# Patient Record
Sex: Male | Born: 1990 | Race: White | Hispanic: No | Marital: Single | Smoking: Current every day smoker
Health system: Southern US, Community
[De-identification: ages and names within clinical notes are randomized; demographics above are authoritative.]

## PROBLEM LIST (undated history)

## (undated) DIAGNOSIS — F4310 Post-traumatic stress disorder, unspecified: Secondary | ICD-10-CM

## (undated) DIAGNOSIS — F909 Attention-deficit hyperactivity disorder, unspecified type: Secondary | ICD-10-CM

## (undated) HISTORY — DX: Post-traumatic stress disorder, unspecified: F43.10

## (undated) HISTORY — PX: ABDOMINAL SURGERY: SHX537

## (undated) HISTORY — DX: Attention-deficit hyperactivity disorder, unspecified type: F90.9

---

## 2019-12-27 ENCOUNTER — Encounter: Payer: Self-pay | Admitting: Emergency Medicine

## 2019-12-27 ENCOUNTER — Other Ambulatory Visit: Payer: Self-pay

## 2019-12-27 ENCOUNTER — Emergency Department
Admission: EM | Admit: 2019-12-27 | Discharge: 2019-12-27 | Disposition: A | Discharge status: Home / Self Care | Payer: Self-pay | Attending: Emergency Medicine | Admitting: Emergency Medicine

## 2019-12-27 DIAGNOSIS — L0291 Cutaneous abscess, unspecified: Secondary | ICD-10-CM

## 2019-12-27 DIAGNOSIS — L02811 Cutaneous abscess of head [any part, except face]: Secondary | ICD-10-CM | POA: Insufficient documentation

## 2019-12-27 DIAGNOSIS — F17200 Nicotine dependence, unspecified, uncomplicated: Secondary | ICD-10-CM | POA: Insufficient documentation

## 2019-12-27 MED ORDER — SULFAMETHOXAZOLE-TRIMETHOPRIM 800-160 MG PO TABS
1.0000 | ORAL_TABLET | Freq: Two times a day (BID) | ORAL | 0 refills | Status: AC
Start: 2019-12-27 — End: ?

## 2019-12-27 MED ORDER — CEPHALEXIN 500 MG PO CAPS
500.0000 mg | ORAL_CAPSULE | Freq: Three times a day (TID) | ORAL | 0 refills | Status: AC
Start: 2019-12-27 — End: ?

## 2019-12-27 NOTE — ED Notes (Signed)
See triage note  Presents with possible abscess area   States he noticed area to back of head about 3 days ago

## 2019-12-27 NOTE — Discharge Instructions (Addendum)
Take the antibiotic as prescribed.  Apply warm compress to the affected areas Return if worsening
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
## 2019-12-27 NOTE — ED Triage Notes (Signed)
Pt to ED with c/o of abscess x2 to back of head. Pt states pain started approx 3 days ago.

## 2020-01-01 ENCOUNTER — Emergency Department: Payer: Self-pay

## 2020-01-01 ENCOUNTER — Other Ambulatory Visit: Payer: Self-pay

## 2020-01-01 ENCOUNTER — Observation Stay
Admission: EM | Admit: 2020-01-01 | Discharge: 2020-01-02 | Discharge status: Against Medical Advice | Payer: Self-pay | Attending: Internal Medicine | Admitting: Internal Medicine

## 2020-01-01 DIAGNOSIS — Z20822 Contact with and (suspected) exposure to covid-19: Secondary | ICD-10-CM | POA: Insufficient documentation

## 2020-01-01 DIAGNOSIS — Z91120 Patient's intentional underdosing of medication regimen due to financial hardship: Secondary | ICD-10-CM | POA: Insufficient documentation

## 2020-01-01 DIAGNOSIS — L0291 Cutaneous abscess, unspecified: Secondary | ICD-10-CM

## 2020-01-01 DIAGNOSIS — F319 Bipolar disorder, unspecified: Secondary | ICD-10-CM

## 2020-01-01 DIAGNOSIS — F17200 Nicotine dependence, unspecified, uncomplicated: Secondary | ICD-10-CM | POA: Insufficient documentation

## 2020-01-01 DIAGNOSIS — T43226A Underdosing of selective serotonin reuptake inhibitors, initial encounter: Secondary | ICD-10-CM | POA: Insufficient documentation

## 2020-01-01 DIAGNOSIS — R569 Unspecified convulsions: Principal | ICD-10-CM

## 2020-01-01 DIAGNOSIS — T43596A Underdosing of other antipsychotics and neuroleptics, initial encounter: Secondary | ICD-10-CM | POA: Insufficient documentation

## 2020-01-01 LAB — CBC
HCT: 34.8 % — ABNORMAL LOW (ref 39.0–52.0)
Hemoglobin: 12.1 g/dL — ABNORMAL LOW (ref 13.0–17.0)
MCH: 30 pg (ref 26.0–34.0)
MCHC: 34.8 g/dL (ref 30.0–36.0)
MCV: 86.1 fL (ref 80.0–100.0)
Platelets: 271 10*3/uL (ref 150–400)
RBC: 4.04 MIL/uL — ABNORMAL LOW (ref 4.22–5.81)
RDW: 13.1 % (ref 11.5–15.5)
WBC: 8 10*3/uL (ref 4.0–10.5)
nRBC: 0 % (ref 0.0–0.2)

## 2020-01-01 LAB — BASIC METABOLIC PANEL
Anion gap: 9 (ref 5–15)
BUN: 12 mg/dL (ref 6–20)
CO2: 26 mmol/L (ref 22–32)
Calcium: 9.1 mg/dL (ref 8.9–10.3)
Chloride: 102 mmol/L (ref 98–111)
Creatinine, Ser: 1 mg/dL (ref 0.61–1.24)
GFR calc Af Amer: >60 mL/min (ref >60)
GFR calc non Af Amer: >60 mL/min (ref >60)
Glucose, Bld: 124 mg/dL — ABNORMAL HIGH (ref 70–99)
Potassium: 4.1 mmol/L (ref 3.5–5.1)
Sodium: 137 mmol/L (ref 135–145)

## 2020-01-01 LAB — SARS CORONAVIRUS 2 BY RT PCR (HOSPITAL ORDER, PERFORMED IN ~~LOC~~ HOSPITAL LAB): SARS Coronavirus 2: NEGATIVE

## 2020-01-01 LAB — ETHANOL: Alcohol, Ethyl (B): <10 mg/dL (ref <10)

## 2020-01-01 LAB — MAGNESIUM: Magnesium: 2.1 mg/dL (ref 1.7–2.4)

## 2020-01-01 MED ORDER — SODIUM CHLORIDE 0.9 % IV SOLN: 75.0000 mL/h | INTRAVENOUS | Status: DC

## 2020-01-01 MED ORDER — ENOXAPARIN SODIUM 40 MG/0.4ML ~~LOC~~ SOLN: 40.0000 mg | SUBCUTANEOUS | Status: DC

## 2020-01-01 MED ORDER — LORAZEPAM 2 MG/ML IJ SOLN
1.0000 mg | Freq: Once | INTRAMUSCULAR | Status: AC
  Administered 2020-01-01: 1 mg via INTRAVENOUS
  Filled 2020-01-01: qty 1

## 2020-01-01 NOTE — ED Triage Notes (Signed)
Pt comes EMS from home after girlfriend couldn't get him to respond on the phone. Pt has been having seizures recently per girlfriend. Denies drugs/alcohol. Has multiple small wounds on body. Being treated for boil on head with antibiotics. CBG 157. Responsive to movement. Right wrist swollen and appears deformed.

## 2020-01-01 NOTE — ED Notes (Signed)
Pt resting in bed in NAD
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
## 2020-01-01 NOTE — ED Notes (Signed)
This Rn attempted to call report. Per ICU charge this Rn will received a call from assigned RN.

## 2020-01-01 NOTE — ED Notes (Signed)
Pt easy to wake and updated about his girlfriend to the best of this RNs ability. Pt feel back to sleep immediatly after assessment and EKG. Call bell in reach, lights dimmed and bed locked and in lowest position.

## 2020-01-01 NOTE — ED Notes (Signed)
Girlfriend at bedside. Dr Erma Heritage speaking to her.

## 2020-01-01 NOTE — ED Notes (Signed)
Pt taken to CT.

## 2020-01-01 NOTE — ED Notes (Signed)
Pt given meal tray and water 
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
## 2020-01-01 NOTE — Consult Note (Signed)
No major DDI noted with current meds with AEDs medications.   Paschal Dopp, PharmD, BCPS

## 2020-01-01 NOTE — ED Notes (Signed)
Pt given a phone to call family.per pt's request.

## 2020-01-02 DIAGNOSIS — R569 Unspecified convulsions: Secondary | ICD-10-CM

## 2020-01-02 LAB — CBC
HCT: 35 % — ABNORMAL LOW (ref 39.0–52.0)
Hemoglobin: 11.9 g/dL — ABNORMAL LOW (ref 13.0–17.0)
MCH: 29.9 pg (ref 26.0–34.0)
MCHC: 34 g/dL (ref 30.0–36.0)
MCV: 87.9 fL (ref 80.0–100.0)
Platelets: 279 10*3/uL (ref 150–400)
RBC: 3.98 MIL/uL — ABNORMAL LOW (ref 4.22–5.81)
RDW: 13 % (ref 11.5–15.5)
WBC: 5.3 10*3/uL (ref 4.0–10.5)
nRBC: 0 % (ref 0.0–0.2)

## 2020-01-02 LAB — BASIC METABOLIC PANEL
Anion gap: 7 (ref 5–15)
BUN: 13 mg/dL (ref 6–20)
CO2: 28 mmol/L (ref 22–32)
Calcium: 9 mg/dL (ref 8.9–10.3)
Chloride: 103 mmol/L (ref 98–111)
Creatinine, Ser: 0.91 mg/dL (ref 0.61–1.24)
GFR calc Af Amer: >60 mL/min (ref >60)
GFR calc non Af Amer: >60 mL/min (ref >60)
Glucose, Bld: 114 mg/dL — ABNORMAL HIGH (ref 70–99)
Potassium: 4.3 mmol/L (ref 3.5–5.1)
Sodium: 138 mmol/L (ref 135–145)

## 2020-01-02 LAB — URINE DRUG SCREEN, QUALITATIVE (ARMC ONLY)
Amphetamines, Ur Screen: POSITIVE — AB
Barbiturates, Ur Screen: NOT DETECTED
Benzodiazepine, Ur Scrn: NOT DETECTED
Cannabinoid 50 Ng, Ur ~~LOC~~: POSITIVE — AB
Cocaine Metabolite,Ur ~~LOC~~: NOT DETECTED
MDMA (Ecstasy)Ur Screen: NOT DETECTED
Methadone Scn, Ur: NOT DETECTED
Opiate, Ur Screen: POSITIVE — AB
Phencyclidine (PCP) Ur S: NOT DETECTED
Tricyclic, Ur Screen: NOT DETECTED

## 2020-01-02 LAB — HIV ANTIBODY (ROUTINE TESTING W REFLEX): HIV Screen 4th Generation wRfx: NONREACTIVE

## 2020-01-02 NOTE — Progress Notes (Signed)
Patient Left AMA , states he can't sleep with all construction , paper signed and witnessed by this writer , removed Telemetry and IV RFA , patient left with Significant other

## 2020-01-02 NOTE — Plan of Care (Signed)
  Problem: Coping: Goal: Ability to adjust to condition or change in health will improve Outcome: Progressing Goal: Ability to identify appropriate support needs will improve Outcome: Progressing   Problem: Health Behavior/Discharge Planning: Goal: Compliance with prescribed medication regimen will improve Outcome: Progressing   Problem: Medication: Goal: Risk for medication side effects will decrease Outcome: Progressing   Problem: Clinical Measurements: Goal: Diagnostic test results will improve Outcome: Progressing   Problem: Safety: Goal: Verbalization of understanding the information provided will improve Outcome: Progressing   Problem: Self-Concept: Goal: Level of anxiety will decrease Outcome: Progressing Goal: Ability to verbalize feelings about condition will improve Outcome: Progressing   Problem: Health Behavior/Discharge Planning: Goal: Ability to manage health-related needs will improve Outcome: Progressing   Problem: Clinical Measurements: Goal: Ability to maintain clinical measurements within normal limits will improve Outcome: Progressing Goal: Will remain free from infection Outcome: Progressing Goal: Diagnostic test results will improve Outcome: Progressing Goal: Respiratory complications will improve Outcome: Progressing   Problem: Activity: Goal: Risk for activity intolerance will decrease Outcome: Progressing   Problem: Nutrition: Goal: Adequate nutrition will be maintained Outcome: Progressing   Problem: Coping: Goal: Level of anxiety will decrease Outcome: Progressing   Problem: Elimination: Goal: Will not experience complications related to bowel motility Outcome: Progressing Goal: Will not experience complications related to urinary retention Outcome: Progressing   Problem: Pain Managment: Goal: General experience of comfort will improve Outcome: Progressing   Problem: Safety: Goal: Ability to remain free from injury will  improve Outcome: Progressing   Problem: Skin Integrity: Goal: Risk for impaired skin integrity will decrease Outcome: Progressing
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
## 2021-07-26 IMAGING — CT CT HEAD W/O CM
3 series · 15 of 47 positions shown, 18 images · non-contrast
Comparison: None.

CLINICAL DATA: Headache.  Seizure.

EXAM:
CT HEAD WITHOUT CONTRAST
TECHNIQUE: Contiguous axial images were obtained from the base of the skull
through the vertex without intravenous contrast.

[Series 3: head wo · axial · 0.45mm/px · z∈[-133,-8]mm · 9 of 31 slices shown, 12 images]
[im 3/31  brain]
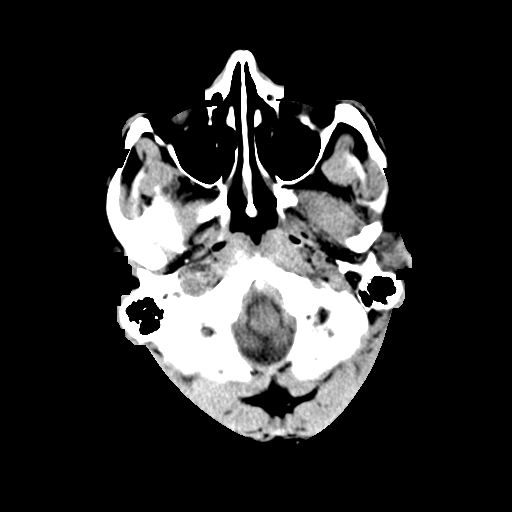
[im 3/31  bone]
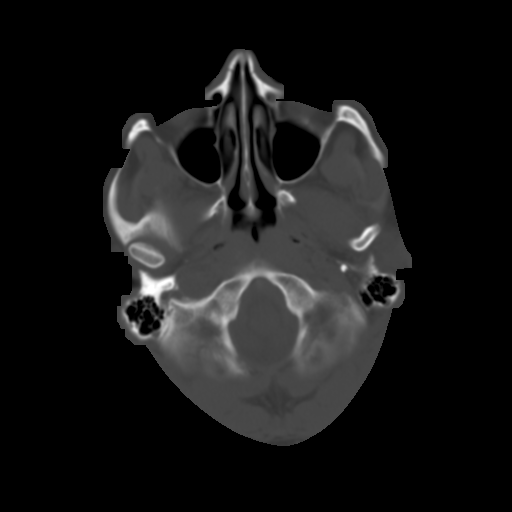
[im 6/31  brain]
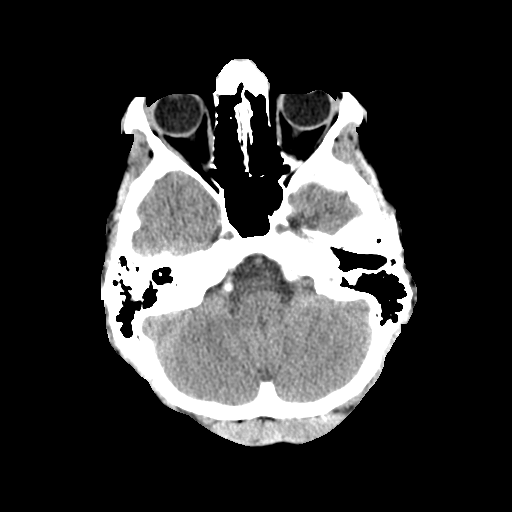
[im 9/31  brain]
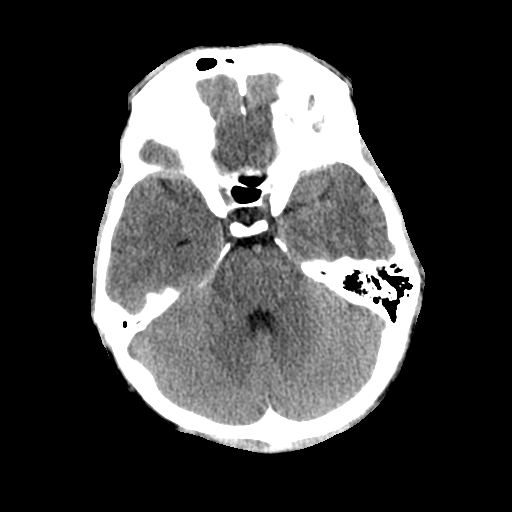
[im 12/31  brain]
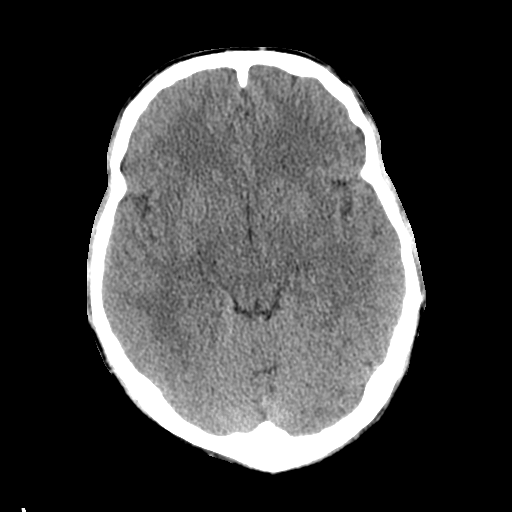
[im 16/31  brain]
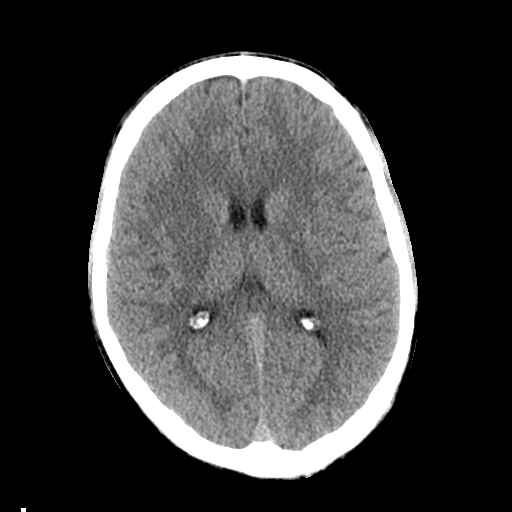
[im 16/31  bone]
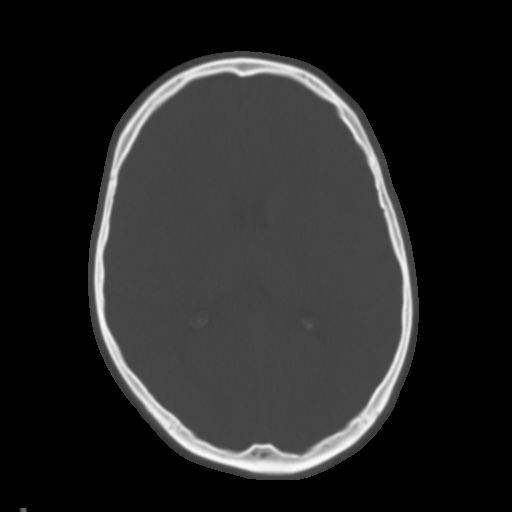
[im 19/31  brain]
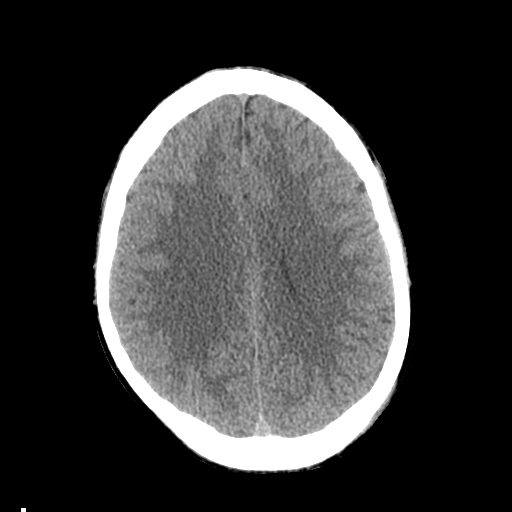
[im 22/31  brain]
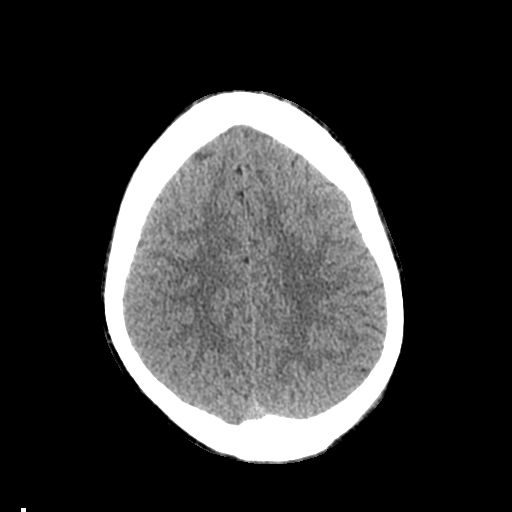
[im 25/31  brain]
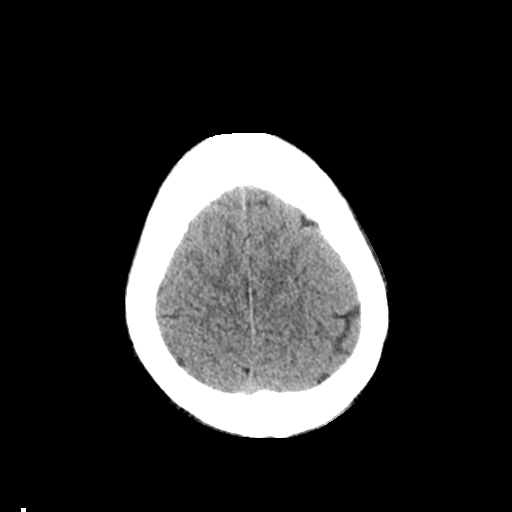
[im 28/31  brain]
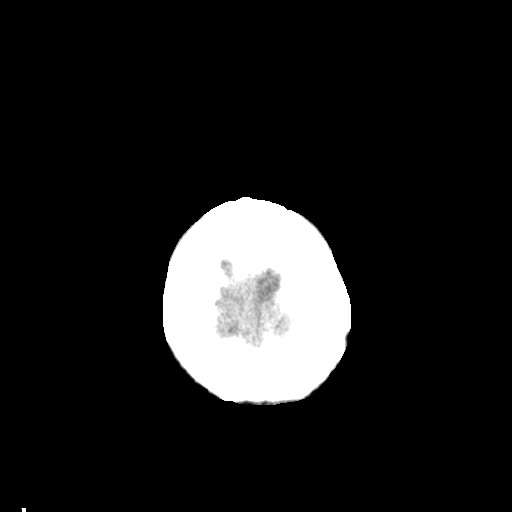
[im 28/31  bone]
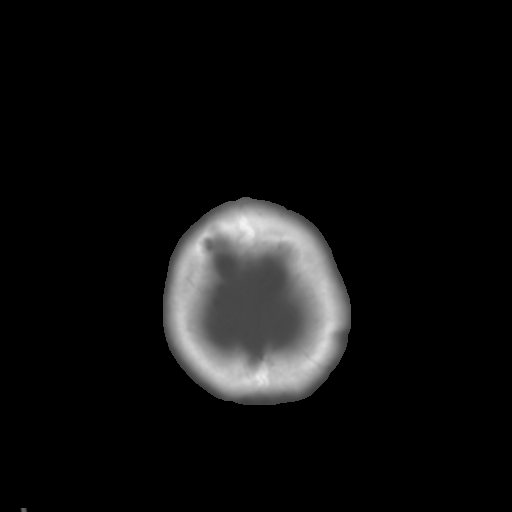

[Series 4: coronal soft tissue · coronal · 0.33mm/px · 3 of 70 slices shown]
[im 24/70  brain]
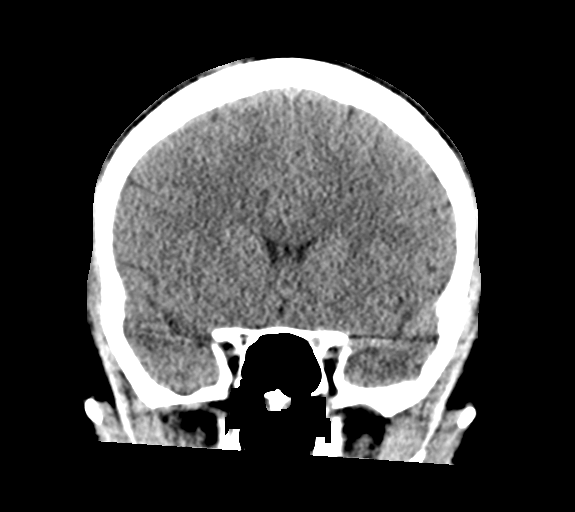
[im 31/70  brain]
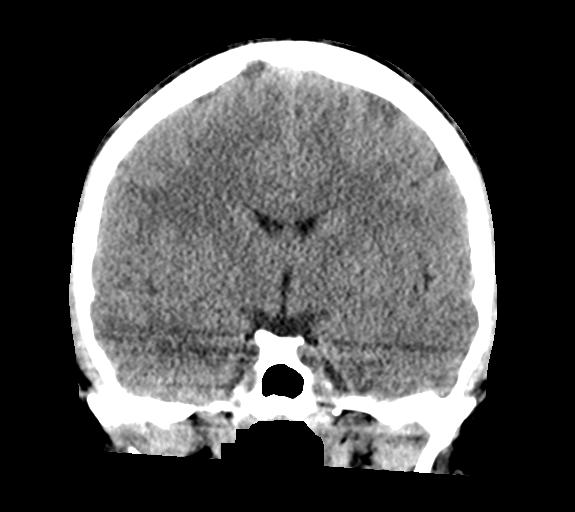
[im 39/70  brain]
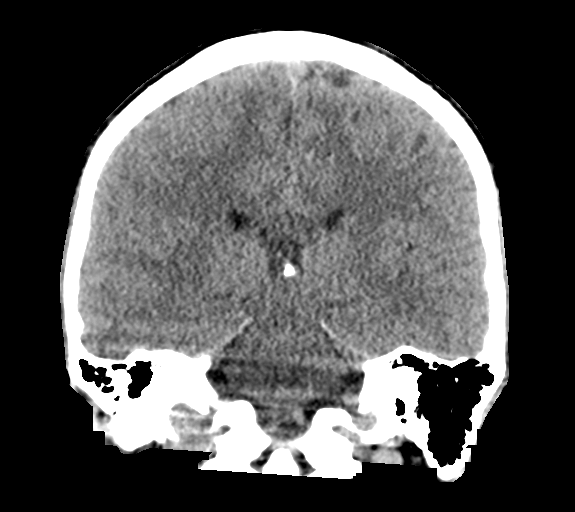

[Series 5: sagittal soft tissue · sagittal · 0.34mm/px · 3 of 55 slices shown]
[im 19/55  brain]
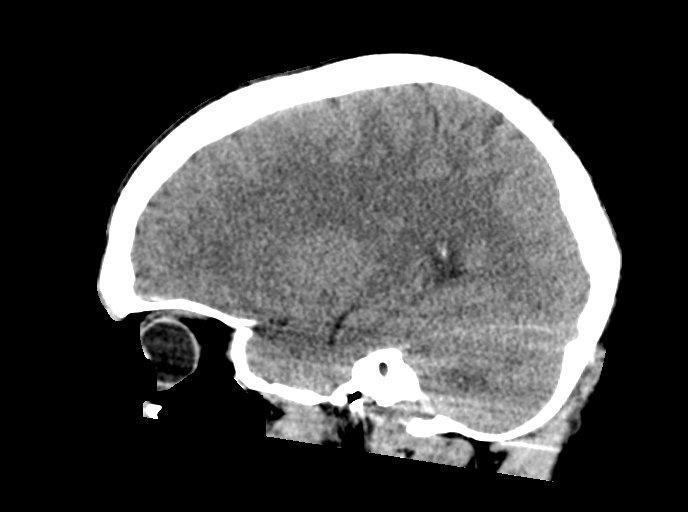
[im 28/55  brain]
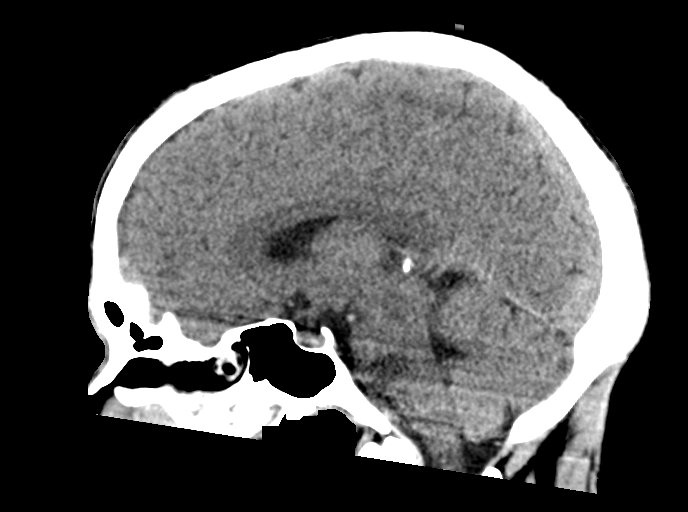
[im 37/55  brain]
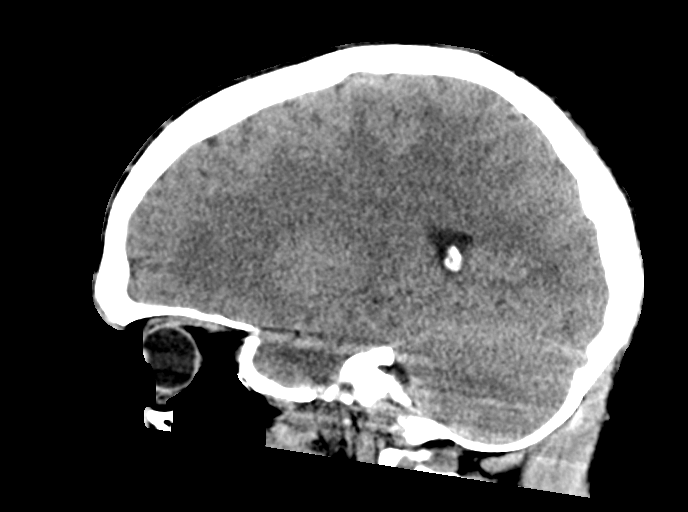

[15 of 47 positions shown; findings below may reference images not displayed]

FINDINGS: Brain: No hemorrhage. No extraaxial collection.No midline shift. The
ventricular system is unremarkable.The basal cisterns are
unremarkable. The grey white differentiation is unremarkable. The
brainstem is unremarkable. The cerebellum is unremarkable. The sella
is unremarkable.

Vascular: No hyperdense vessel or unexpected calcification.

Skull: The calvarium is unremarkable. The skull base is
unremarkable. The visualized upper cervical spine is unremarkable.

Sinuses/Orbits: The visualized orbits are unremarkable. The
paranasal sinuses are unremarkable. The mastoid air cells are clear.

Other: The visualized parotid gland is unremarkable. There is
posterior vertex scalp swelling on the left.
IMPRESSION: 1. No acute intracranial abnormality.
2. Posterior vertex scalp swelling on the left.

## 2021-07-26 IMAGING — CR DG HAND COMPLETE 3+V*R*
3 series · 3 of 3 positions shown · non-contrast
Comparison: None.

CLINICAL DATA: Right wrist pain, hand pain, swelling

EXAM:
RIGHT HAND - COMPLETE 3+ VIEW

[hand ap]
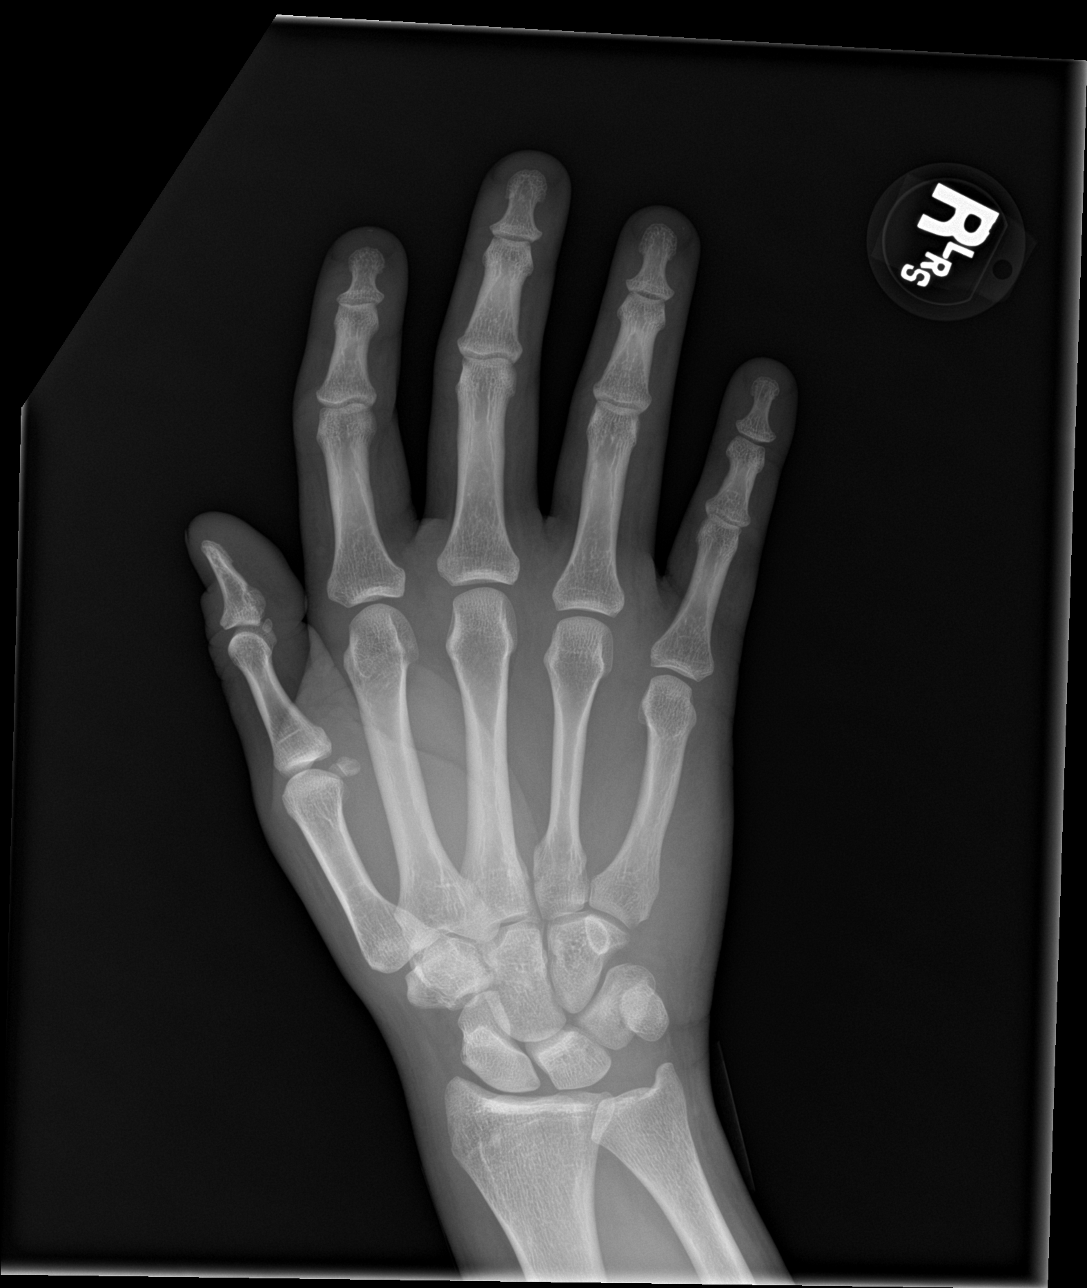

[hand obl]
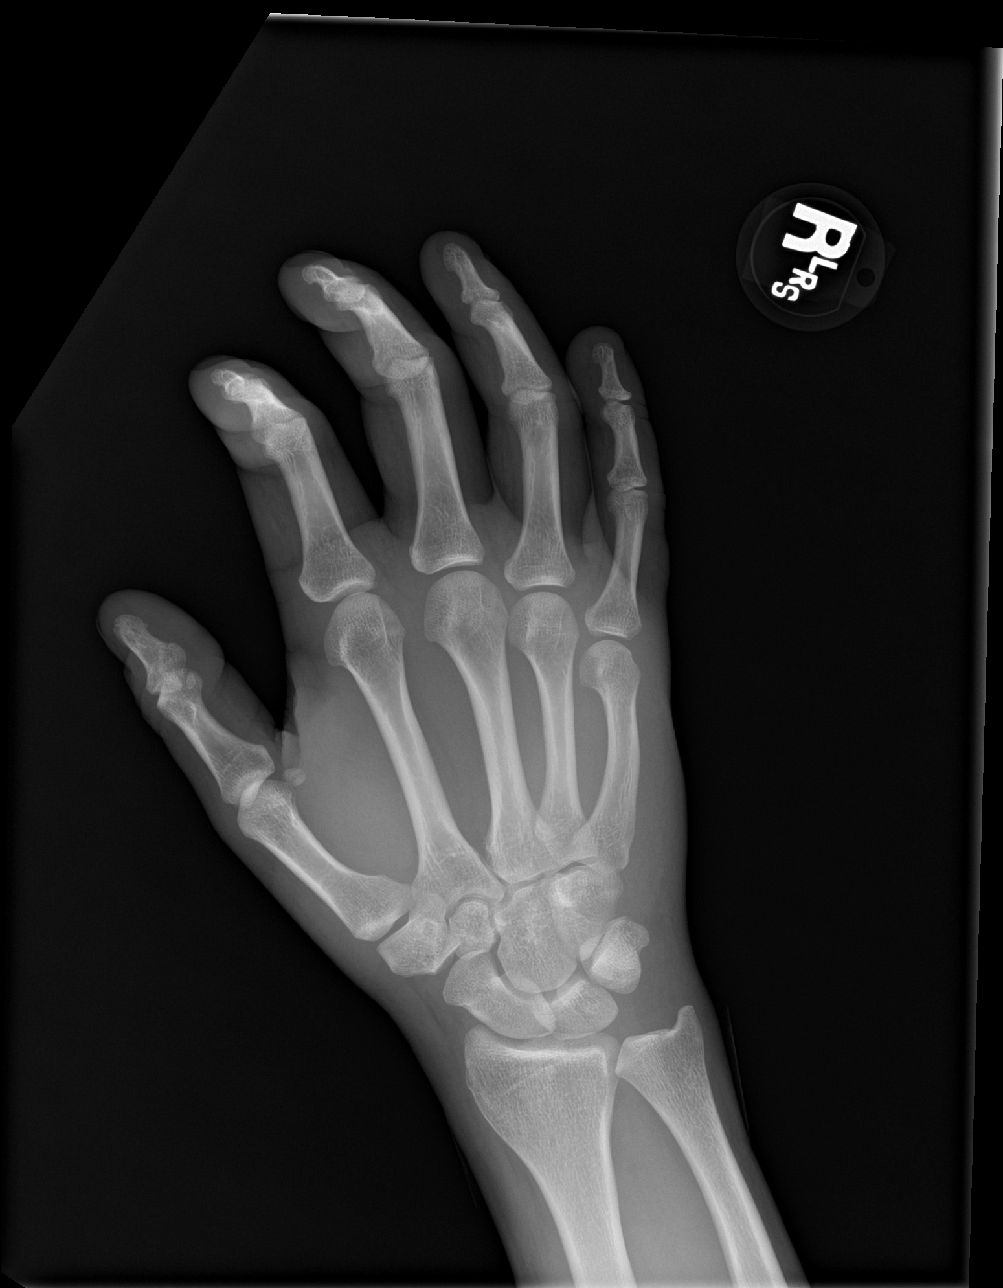

[hand lat]
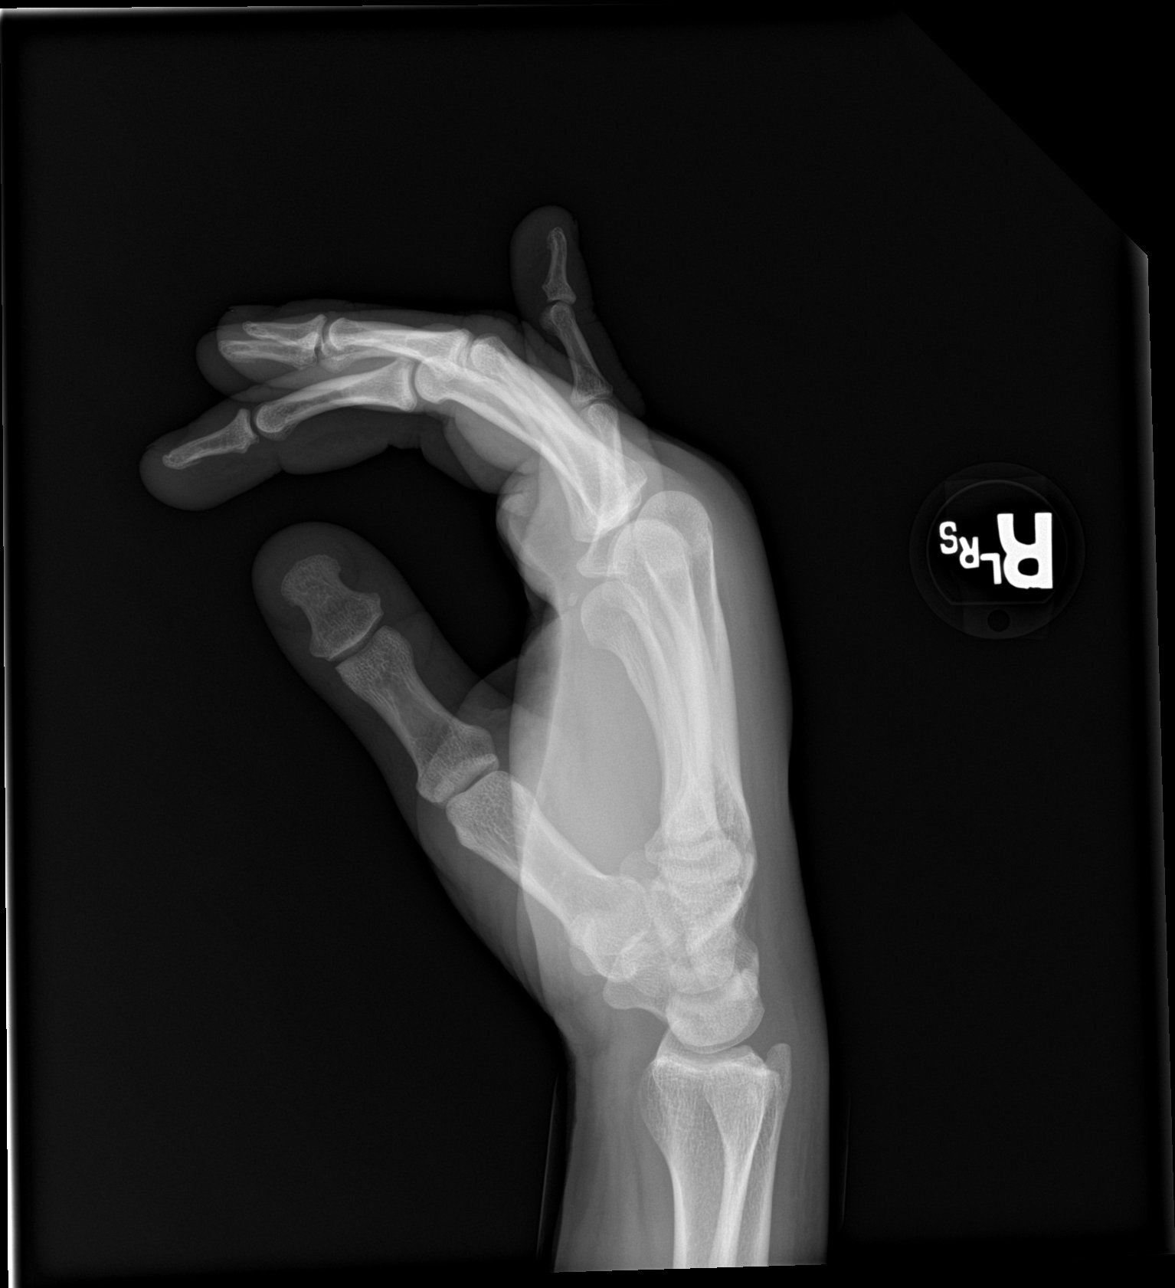

[3 of 3 positions shown; findings below may reference images not displayed]

FINDINGS: Soft tissue swelling posteriorly within the right hand. No acute
bony abnormality. Specifically, no fracture, subluxation, or
dislocation.
IMPRESSION: No acute bony abnormality.
